# Patient Record
Sex: Male | Born: 1995 | Race: White | Hispanic: No | Marital: Single | Smoking: Current every day smoker
Health system: Southern US, Community
[De-identification: ages and names within clinical notes are randomized; demographics above are authoritative.]

## PROBLEM LIST (undated history)

## (undated) DIAGNOSIS — Q539 Undescended testicle, unspecified: Secondary | ICD-10-CM

## (undated) DIAGNOSIS — N200 Calculus of kidney: Secondary | ICD-10-CM

## (undated) HISTORY — DX: Undescended testicle, unspecified: Q53.9

## (undated) HISTORY — DX: Calculus of kidney: N20.0

---
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
## 2016-02-02 ENCOUNTER — Emergency Department: Payer: 59

## 2016-02-02 ENCOUNTER — Encounter: Payer: Self-pay | Admitting: Medical Oncology

## 2016-02-02 ENCOUNTER — Emergency Department
Admission: EM | Admit: 2016-02-02 | Discharge: 2016-02-02 | Disposition: A | Discharge status: Home / Self Care | Payer: 59 | Attending: Student | Admitting: Student

## 2016-02-02 DIAGNOSIS — N50812 Left testicular pain: Secondary | ICD-10-CM | POA: Diagnosis present

## 2016-02-02 DIAGNOSIS — R52 Pain, unspecified: Secondary | ICD-10-CM

## 2016-02-02 DIAGNOSIS — R1030 Lower abdominal pain, unspecified: Secondary | ICD-10-CM | POA: Insufficient documentation

## 2016-02-02 DIAGNOSIS — N201 Calculus of ureter: Secondary | ICD-10-CM | POA: Diagnosis not present

## 2016-02-02 LAB — URINALYSIS COMPLETE WITH MICROSCOPIC (ARMC ONLY)
BACTERIA UA: NONE SEEN
Bilirubin Urine: NEGATIVE
Glucose, UA: NEGATIVE mg/dL
Leukocytes, UA: NEGATIVE
Nitrite: NEGATIVE
Protein, ur: 30 mg/dL — AB
Specific Gravity, Urine: 1.026 (ref 1.005–1.030)
pH: 6 (ref 5.0–8.0)

## 2016-02-02 LAB — CHLAMYDIA/NGC RT PCR (ARMC ONLY)
CHLAMYDIA TR: NOT DETECTED
N gonorrhoeae: NOT DETECTED

## 2016-02-02 LAB — COMPREHENSIVE METABOLIC PANEL
ALK PHOS: 71 U/L (ref 38–126)
ALT: 23 U/L (ref 17–63)
ANION GAP: 11 (ref 5–15)
AST: 21 U/L (ref 15–41)
Albumin: 5 g/dL (ref 3.5–5.0)
BUN: 13 mg/dL (ref 6–20)
CALCIUM: 9.5 mg/dL (ref 8.9–10.3)
CO2: 25 mmol/L (ref 22–32)
CREATININE: 1.29 mg/dL — AB (ref 0.61–1.24)
Chloride: 103 mmol/L (ref 101–111)
Glucose, Bld: 110 mg/dL — ABNORMAL HIGH (ref 65–99)
Potassium: 3.7 mmol/L (ref 3.5–5.1)
SODIUM: 139 mmol/L (ref 135–145)
TOTAL PROTEIN: 8.2 g/dL — AB (ref 6.5–8.1)
Total Bilirubin: 1.3 mg/dL — ABNORMAL HIGH (ref 0.3–1.2)

## 2016-02-02 LAB — CBC WITH DIFFERENTIAL/PLATELET
Basophils Absolute: 0 10*3/uL (ref 0–0.1)
Basophils Relative: 0 %
EOS ABS: 0.1 10*3/uL (ref 0–0.7)
HCT: 50.7 % (ref 40.0–52.0)
HEMOGLOBIN: 17.3 g/dL (ref 13.0–18.0)
LYMPHS ABS: 1.7 10*3/uL (ref 1.0–3.6)
MCH: 28.1 pg (ref 26.0–34.0)
MCHC: 34.1 g/dL (ref 32.0–36.0)
MCV: 82.4 fL (ref 80.0–100.0)
Monocytes Absolute: 1.1 10*3/uL — ABNORMAL HIGH (ref 0.2–1.0)
Neutro Abs: 15.4 10*3/uL — ABNORMAL HIGH (ref 1.4–6.5)
Platelets: 264 10*3/uL (ref 150–440)
RBC: 6.15 MIL/uL — ABNORMAL HIGH (ref 4.40–5.90)
RDW: 13.4 % (ref 11.5–14.5)
WBC: 18.4 10*3/uL — ABNORMAL HIGH (ref 3.8–10.6)

## 2016-02-02 LAB — LIPASE, BLOOD: LIPASE: 22 U/L (ref 11–51)

## 2016-02-02 MED ORDER — SODIUM CHLORIDE 0.9 % IV BOLUS (SEPSIS)
1000.0000 mL | Freq: Once | INTRAVENOUS | Status: AC
  Administered 2016-02-02: 1000 mL via INTRAVENOUS

## 2016-02-02 MED ORDER — IOPAMIDOL (ISOVUE-300) INJECTION 61%
100.0000 mL | Freq: Once | INTRAVENOUS | Status: AC | PRN
  Administered 2016-02-02: 100 mL via INTRAVENOUS

## 2016-02-02 MED ORDER — OXYCODONE HCL 5 MG PO TABS
10.0000 mg | ORAL_TABLET | Freq: Once | ORAL | Status: AC
  Administered 2016-02-02: 10 mg via ORAL
  Filled 2016-02-02: qty 2

## 2016-02-02 MED ORDER — KETOROLAC TROMETHAMINE 30 MG/ML IJ SOLN
15.0000 mg | Freq: Once | INTRAMUSCULAR | Status: AC
  Administered 2016-02-02: 15 mg via INTRAVENOUS
  Filled 2016-02-02: qty 1

## 2016-02-02 MED ORDER — TAMSULOSIN HCL 0.4 MG PO CAPS: 0.4000 mg | ORAL_CAPSULE | Freq: Every day | ORAL | Status: AC

## 2016-02-02 MED ORDER — ONDANSETRON 4 MG PO TBDP
4.0000 mg | ORAL_TABLET | Freq: Once | ORAL | Status: AC
  Administered 2016-02-02: 4 mg via ORAL
  Filled 2016-02-02: qty 1

## 2016-02-02 MED ORDER — DIATRIZOATE MEGLUMINE & SODIUM 66-10 % PO SOLN
15.0000 mL | Freq: Once | ORAL | Status: AC
  Administered 2016-02-02: 15 mL via ORAL

## 2016-02-02 MED ORDER — ONDANSETRON 4 MG PO TBDP: 4.0000 mg | ORAL_TABLET | Freq: Three times a day (TID) | ORAL | Status: AC | PRN

## 2016-02-02 MED ORDER — OXYCODONE HCL 5 MG PO TABS: 5.0000 mg | ORAL_TABLET | Freq: Four times a day (QID) | ORAL | Status: AC | PRN

## 2016-02-02 NOTE — ED Notes (Signed)
Patient in lobby restroom vomiting. First nurse made aware and patient will be placed in room as soon as one is available.

## 2016-02-02 NOTE — ED Provider Notes (Signed)
[facility] Emergency Department Provider Note   ____________________________________________  Time seen: Approximately 2:16 PM  I have reviewed the triage vital signs and the nursing notes.   HISTORY  Chief Complaint Testicle Pain    HPI Jared Elliott is a 20 y.o. male with no chronic medical problems who presents for evaluation of 3 days of intermittent left testicle/scrotal pain and into the left abdomen and nonbloody nonbilious emesis, gradual onset, intermittent, currently moderate, no modifying factors or patient reports that he developed pain in the left groin/testicle and vomiting 3 days ago however it resolved. It recurred today. No diarrhea, no fevers or chills. No chest pain or difficulty breathing. He denies any penile discharge. He is sexually active and reports some concern for possible sexually transmitted infection. He denies any dysuria or hematuria. He had a normal bowel movement earlier this morning.   History reviewed. No pertinent past medical history.  There are no active problems to display for this patient.   History reviewed. No pertinent past surgical history.  No current outpatient prescriptions on file.  Allergies Review of patient's allergies indicates no known allergies.  No family history on file.  Social History Social History  Substance Use Topics  . Smoking status: None  . Smokeless tobacco: None  . Alcohol Use: None    Review of Systems Constitutional: No fever/chills Eyes: No visual changes. ENT: No sore throat. Cardiovascular: Denies chest pain. Respiratory: Denies shortness of breath. Gastrointestinal: No abdominal pain.  + nausea, +vomiting.  No diarrhea.  No constipation. Genitourinary: Negative for dysuria. Musculoskeletal: Negative for back pain. Skin: Negative for rash. Neurological: Negative for headaches, focal weakness or numbness.  10-point ROS otherwise  negative.  ____________________________________________   PHYSICAL EXAM:  Filed Vitals:   02/02/16 1635 02/02/16 1700 02/02/16 1730 02/02/16 1800  BP:  136/88 136/87 147/89  Pulse: 65 58 55 59  Temp:      TempSrc:      Resp:      Height:      Weight:      SpO2: 99% 98% 98% 97%    VITAL SIGNS: ED Triage Vitals  Enc Vitals Group     BP 02/02/16 1209 142/106 mmHg     Pulse Rate 02/02/16 1209 85     Resp 02/02/16 1209 18     Temp 02/02/16 1209 97.8 F (36.6 C)     Temp Source 02/02/16 1209 Oral     SpO2 02/02/16 1209 97 %     Weight 02/02/16 1209 220 lb (99.791 kg)     Height 02/02/16 1209 5\' 10"  (1.778 m)     Head Cir --      Peak Flow --      Pain Score 02/02/16 1209 10     Pain Loc --      Pain Edu? --      Excl. in GC? --     Constitutional: Alert and oriented. Well appearing and in no acute distress. Eyes: Conjunctivae are normal. PERRL. EOMI. Head: Atraumatic. Nose: No congestion/rhinnorhea. Mouth/Throat: Mucous membranes are moist.  Oropharynx non-erythematous. Neck: No stridor. Supple without meningismus.  Cardiovascular: Normal rate, regular rhythm. Grossly normal heart sounds.  Good peripheral circulation. Respiratory: Normal respiratory effort.  No retractions. Lungs CTAB. Gastrointestinal: Soft and nontender. No distention.  No CVA tenderness. Genitourinary: uncircumcised penis, testicles are descended bilaterally, mild tenderness in the left hemiscrotum without mass, color change, erythema, induration or fluctuance. No tenderness throughout the inguinal canal bilaterally,  no hernia sac or bulge. Musculoskeletal: No lower extremity tenderness nor edema.  No joint effusions. Neurologic:  Normal speech and language. No gross focal neurologic deficits are appreciated. No gait instability. Skin:  Skin is warm, dry and intact. No rash noted. Psychiatric: Mood and affect are normal. Speech and behavior are normal.  ____________________________________________    LABS (all labs ordered are listed, but only abnormal results are displayed)  Labs Reviewed  URINALYSIS COMPLETEWITH MICROSCOPIC ([facility] ONLY) - Abnormal; Notable for the following:    Color, Urine YELLOW (*)    APPearance CLEAR (*)    Ketones, ur 1+ (*)    Hgb urine dipstick 3+ (*)    Protein, ur 30 (*)    Squamous Epithelial / LPF 0-5 (*)    All other components within normal limits  CBC WITH DIFFERENTIAL/PLATELET - Abnormal; Notable for the following:    WBC 18.4 (*)    RBC 6.15 (*)    Neutro Abs 15.4 (*)    Monocytes Absolute 1.1 (*)    All other components within normal limits  COMPREHENSIVE METABOLIC PANEL - Abnormal; Notable for the following:    Glucose, Bld 110 (*)    Creatinine, Ser 1.29 (*)    Total Protein 8.2 (*)    Total Bilirubin 1.3 (*)    All other components within normal limits  CHLAMYDIA/NGC RT PCR ([facility] ONLY)  LIPASE, BLOOD   ____________________________________________  EKG  none ____________________________________________  RADIOLOGY  Ultrasound scrotum IMPRESSION: 1. There is no evidence of testicular torsion, intra testicular mass, nor orchitis. 2. Simple appearing right-sided epididymal cyst measuring up to 5.2 mm in greatest dimension. No vascular changes to suggest acute epididymitis. Normal-appearing left epididymis. 3. No left-sided scrotal abnormalities are observed.  CT abdomen and pelvis IMPRESSION: 5 x 3 mm calculus distal left ureter with mild hydronephrosis on the left. There is mild delayed enhancement of the left kidney compared to the right, suggesting a degree of parenchymal edema in the left kidney.  No lesions are identified in either inguinal region.  No bowel obstruction. No abscess. Appendix appears normal.  Bibasilar lung atelectatic  ____________________________________________   PROCEDURES  Procedure(s) performed: None  Critical Care performed:  No  ____________________________________________   INITIAL IMPRESSION / ASSESSMENT AND PLAN / ED COURSE  Pertinent labs & imaging results that were available during my care of the patient were reviewed by me and considered in my medical decision making (see chart for details).  Jared Elliott is a 20 y.o. male with no chronic medical problems who presents for evaluation of 3 days of intermittent left testicle/scrotal and abdominal pain and nonbloody nonbilious emesis. On exam, he is nontoxic appearing and in no acute distress. His vital signs are stable, he is afebrile. He has mild tenderness of the left hemiscrotum but otherwise his exam is unremarkable. Ultrasound of the scrotum obtain at triage shows no torsion or orchitis, there is a simple right-sided ependymal cyst but no evidence of epididymitis. We'll obtain screening labs, ua, gc/chlamydia, treat him symptomatically, reassess for need for advanced imaging.  ----------------------------------------- 6:15 PM on 02/02/2016 ----------------------------------------- Patient with significant improvement of his pain at this time. CBC was notable for leukocytosis. CMP with mild creatinine elevation at 1.29, IV fluids given. Urinalysis with several red blood cells but does not appear infected. CT of the abdomen and pelvis shows a 5 mm distal left ureteral calculus which is the most likely cause of his symptoms/complaints today. His pain is well controlled, he does not  appear septic. We'll DC with return precautions, expectant management, close urology follow-up. The patient and his family at bedside are comfortable with the discharge plan. GC and chlamydia are still pending at the time of discharge.  ____________________________________________   FINAL CLINICAL IMPRESSION(S) / ED DIAGNOSES  Final diagnoses:  Pain in left testicle  Ureterolithiasis      NEW MEDICATIONS STARTED DURING THIS VISIT:  New Prescriptions   No medications  on file     Note:  This document was prepared using Dragon voice recognition software and may include unintentional dictation errors.    Gayla Doss, MD 02/02/16 [phone]

## 2016-02-02 NOTE — ED Notes (Signed)
Pt c/o left sided testicular pain that started Saturday and went away then began again this am. Denies injury.

## 2016-02-12 ENCOUNTER — Ambulatory Visit (INDEPENDENT_AMBULATORY_CARE_PROVIDER_SITE_OTHER): Payer: 59 | Admitting: Urology

## 2016-02-12 ENCOUNTER — Encounter: Payer: Self-pay | Admitting: Urology

## 2016-02-12 VITALS — BP 129/87 | HR 98 | Ht 70.0 in | Wt 222.0 lb

## 2016-02-12 DIAGNOSIS — N132 Hydronephrosis with renal and ureteral calculous obstruction: Secondary | ICD-10-CM

## 2016-02-12 DIAGNOSIS — N201 Calculus of ureter: Secondary | ICD-10-CM

## 2016-02-12 DIAGNOSIS — N503 Cyst of epididymis: Secondary | ICD-10-CM | POA: Diagnosis not present

## 2016-02-12 LAB — MICROSCOPIC EXAMINATION
BACTERIA UA: NONE SEEN
RBC, UA: >30 /hpf — AB (ref 0–?)
WBC, UA: NONE SEEN /hpf (ref 0–?)

## 2016-02-12 LAB — URINALYSIS, COMPLETE
BILIRUBIN UA: NEGATIVE
Glucose, UA: NEGATIVE
Ketones, UA: NEGATIVE
LEUKOCYTES UA: NEGATIVE
Nitrite, UA: NEGATIVE
PH UA: 8.5 — AB (ref 5.0–7.5)
PROTEIN UA: NEGATIVE
Specific Gravity, UA: 1.02 (ref 1.005–1.030)
Urobilinogen, Ur: 0.2 mg/dL (ref 0.2–1.0)

## 2016-02-12 NOTE — Progress Notes (Signed)
02/12/2016 11:41 AM   Lisabeth Pick April 16, 1996 [id number]  Referring provider: Dortha Kern, MD [address] [place], [place] [address]  Chief Complaint  Patient presents with  . Nephrolithiasis    New Patient    HPI: Pleasant 20 year old male who presents to the office today to establish care. He was significant the emergency room  on 02/02/2016 with acute onset left flank pain radiating down to his testicle. He underwent further workup including a noncontrast CT scan and scrotal ultrasound. His scrotal ultrasound was unremarkable. He was noted to have a 5 x 3 mm left UVJ stone.  At the time of presentation, he did have an elevated white count to 18.4 with a negative UA.  Creatinine was also mildly elevated to 1.29. He denied any associated fevers or chills. He was having some nausea and vomiting which is since resolved.  He has continued to strain his urine on most occasions and has not seen the stone pass. He continues to have some mild dull left lower quadrant pain but not nearly as significant as before. No dysuria or gross hematuria. No fevers or chills.  He has no previous history of kidney stones prior to this.  He is accompanied to the office today by his brother.  PMH: Past Medical History  Diagnosis Date  . Nephrolithiasis   . Undescended testicle     Surgical History: History reviewed. No pertinent past surgical history.  Home Medications:    Medication List       This list is accurate as of: 02/12/16 11:59 PM.  Always use your most recent med list.               ondansetron 4 MG disintegrating tablet  Commonly known as:  ZOFRAN ODT  Take 1 tablet (4 mg total) by mouth every 8 (eight) hours as needed for nausea or vomiting.     oxyCODONE 5 MG immediate release tablet  Commonly known as:  ROXICODONE  Take 1 tablet (5 mg total) by mouth every 6 (six) hours as needed for moderate pain. Do not drive while taking this medication.     tamsulosin 0.4  MG Caps capsule  Commonly known as:  FLOMAX  Take 1 capsule (0.4 mg total) by mouth daily.        Allergies: No Known Allergies  Family History: Family History  Problem Relation Age of Onset  . Bladder Cancer Neg Hx   . Prostate cancer Neg Hx   . Kidney cancer Neg Hx     Social History:  reports that he has been smoking Cigarettes.  He has been smoking about 0.50 packs per day. He does not have any smokeless tobacco history on file. He reports that he uses illicit drugs (Marijuana). He reports that he does not drink alcohol.  ROS: UROLOGY Frequent Urination?: No Hard to postpone urination?: No Burning/pain with urination?: No Get up at night to urinate?: No Leakage of urine?: No Urine stream starts and stops?: No Trouble starting stream?: No Do you have to strain to urinate?: No Blood in urine?: No Urinary tract infection?: No Sexually transmitted disease?: No Injury to kidneys or bladder?: No Painful intercourse?: No Weak stream?: No Erection problems?: No Penile pain?: Yes  Gastrointestinal Nausea?: Yes Vomiting?: Yes Indigestion/heartburn?: No Diarrhea?: No Constipation?: No  Constitutional Fever: No Night sweats?: No Weight loss?: No Fatigue?: No  Skin Skin rash/lesions?: No Itching?: No  Eyes Blurred vision?: No Double vision?: No  Ears/Nose/Throat Sore throat?: No  Sinus problems?: No  Hematologic/Lymphatic Swollen glands?: No Easy bruising?: No  Cardiovascular Leg swelling?: No Chest pain?: No  Respiratory Cough?: No Shortness of breath?: No  Endocrine Excessive thirst?: No  Musculoskeletal Back pain?: No Joint pain?: No  Neurological Headaches?: No Dizziness?: No  Psychologic Depression?: No Anxiety?: No  Physical Exam: BP 129/87 mmHg  Pulse 98  Ht  (1.778 m)  Wt 222 lb (100.699 kg)  BMI 31.85 kg/m2  Constitutional:  Alert and oriented, No acute distress. HEENT: [place] AT, moist mucus membranes.  Trachea  midline, no masses. Cardiovascular: No clubbing, cyanosis, or edema. Respiratory: Normal respiratory effort, no increased work of breathing. GI: Abdomen is soft, nontender, nondistended, no abdominal masses GU: No CVA tenderness.  Skin: No rashes, bruises or suspicious lesions. Lymph: No cervical denopathy. Neurologic: Grossly intact, no focal deficits, moving all 4 extremities. Psychiatric: Normal mood and affect.  Laboratory Data: Lab Results  Component Value Date   WBC 18.4* 02/02/2016   HGB 17.3 02/02/2016   HCT 50.7 02/02/2016   MCV 82.4 02/02/2016   PLT 264 02/02/2016    Lab Results  Component Value Date   CREATININE 1.29* 02/02/2016   Urinalysis Results for orders placed or performed in visit on 02/12/16  Microscopic Examination  Result Value Ref Range   WBC, UA None seen 0 -  5 /hpf   RBC, UA >30 (A) 0 -  2 /hpf   Epithelial Cells (non renal) 0-10 0 - 10 /hpf   Bacteria, UA None seen None seen/Few  Urinalysis, Complete  Result Value Ref Range   Specific Gravity, UA 1.020 1.005 - 1.030   pH, UA 8.5 (H) 5.0 - 7.5   Color, UA Yellow Yellow   Appearance Ur Cloudy (A) Clear   Leukocytes, UA Negative Negative   Protein, UA Negative Negative/Trace   Glucose, UA Negative Negative   Ketones, UA Negative Negative   RBC, UA 3+ (A) Negative   Bilirubin, UA Negative Negative   Urobilinogen, Ur 0.2 0.2 - 1.0 mg/dL   Nitrite, UA Negative Negative   Microscopic Examination See below:     Pertinent Imaging: Study Result     CLINICAL DATA: Left-sided inguinal and scrotal region pain  EXAM: CT ABDOMEN AND PELVIS WITH CONTRAST  TECHNIQUE: Multidetector CT imaging of the abdomen and pelvis was performed using the standard protocol following bolus administration of intravenous contrast. Oral contrast was also administered.  CONTRAST: ISOVUE-300 IOPAMIDOL (ISOVUE-300) INJECTION 61%  COMPARISON: Scrotal ultrasound Feb 02, 2016  FINDINGS: Lower chest:  There is atelectatic change in the posterior left and right lung bases, slightly more on the left than on the right. Lung bases otherwise are clear.  Hepatobiliary: There is focal fatty infiltration near the fissure for the ligamentum teres. Beyond this apparent focal fatty infiltration, no focal liver lesions are evident. Gallbladder wall is not appreciably thickened. There is no biliary duct dilatation.  Pancreas: No pancreatic mass or inflammatory focus is evident.  Spleen: No splenic lesions are evident. A splenule is noted medial to the spleen.  Adrenals/Urinary Tract: Adrenals appear normal bilaterally. There is no renal mass on either side. There is mild delayed enhancement on the left compared to the right side which appears normal. There is no hydronephrosis on the right. There is mild hydronephrosis on the left. There is no right-sided intrarenal or ureteral calculus. There is no intrarenal calculus on the left. There is, however, a calculus in the distal left ureter near the ureterovesical junction measuring  5 x 3 mm. No other ureteral calculi are evident. Urinary bladder is midline with wall thickness within normal limits.  Stomach/Bowel: There is no bowel wall or mesenteric thickening. No bowel obstruction. No free air or portal venous air.  Vascular/Lymphatic: There is no abdominal aortic aneurysm. No vascular lesions are evident. A circumaortic left renal vein is an anatomic variant.  Reproductive: Prostate and seminal vesicles appear normal. No lesions are identified in the scrotal areas by CT. There is no pelvic mass or pelvic fluid collection.  Other: Appendix appears normal. There is no ascites or abscess in the abdomen or pelvis.  Musculoskeletal: There are no blastic or lytic bone lesions. There is no intramuscular or abdominal wall lesion.  IMPRESSION: 5 x 3 mm calculus distal left ureter with mild hydronephrosis on the left. There is mild  delayed enhancement of the left kidney compared to the right, suggesting a degree of parenchymal edema in the left kidney.  No lesions are identified in either inguinal region.  No bowel obstruction. No abscess. Appendix appears normal.  Bibasilar lung atelectatic change posteriorly noted.   Electronically Signed  By: Bretta BangWilliam Woodruff III M.D.  On: 02/02/2016 17:32           Study Result     CLINICAL DATA: Left-sided scrotal discomfort began 3 days ago and then stopped an returned today; no known injury.  EXAM: SCROTAL ULTRASOUND  DOPPLER ULTRASOUND OF THE TESTICLES  TECHNIQUE: Complete ultrasound examination of the testicles, epididymis, and other scrotal structures was performed. Color and spectral Doppler ultrasound were also utilized to evaluate blood flow to the testicles.  COMPARISON: None in PACs  FINDINGS: Right testicle  Measurements: 3.9 x 2.1 x 2.5 cm. No mass or microlithiasis visualized.  Left testicle  Measurements: 3.9 x 2.1 x 2.5 cm. No mass or microlithiasis visualized.  Right epididymis: There it is a simple appearing right epididymal cyst measuring 3.9 x 5.2 x 3.5 mm. Vascularity within the epididymis is normal.  Left epididymis: Normal in size and appearance.  Hydrocele: None visualized.  Varicocele: None visualized.  Pulsed Doppler interrogation of both testes demonstrates normal low resistance arterial and venous waveforms bilaterally.  IMPRESSION: 1. There is no evidence of testicular torsion, intra testicular mass, nor orchitis. 2. Simple appearing right-sided epididymal cyst measuring up to 5.2 mm in greatest dimension. No vascular changes to suggest acute epididymitis. Normal-appearing left epididymis. 3. No left-sided scrotal abnormalities are observed.   Electronically Signed  By: David SwazilandJordan M.D.  On: 02/02/2016 13:34    CT scan of scrotal ultrasound personally reviewed  today and with the patient.  Assessment & Plan:    1. Ureteral calculus, left We discussed various treatment options including ESWL vs. ureteroscopy, laser lithotripsy, and stent vs. Continued medical explusive therapy. We discussed the risks and benefits of both including bleeding, infection, damage to surrounding structures, efficacy with need for possible further intervention, and need for temporary ureteral stent.  He is most interested in continued medical expulsive therapy. He was given additional urinary strainer and Flomax.  Reviewed warning symptoms in detail today including fevers, chills, or any other significant urinary symptoms. He will present emergently to ED if he has any concerning signs or symptoms.  Plan for follow-up in 2 weeks with a KUB for further assessment. He was advised if he does see the stone pass, he should bring up her stone analysis.  - Urinalysis, Complete - DG Abd 1 View; Future  2. Hydronephrosis with urinary obstruction due to ureteral  calculus Secondary to #1  3. Epididymal cyst Incidental right epididymal cyst on scrotal ultrasound, asymptomatic. No intervention recommended.   Return in about 2 weeks (around 02/26/2016) for KUB (any provider if needed).  Vanna Scotland, MD  [facility] [address] [place], [place] [address] [phone]

## 2016-02-16 ENCOUNTER — Encounter: Payer: Self-pay | Admitting: Urology

## 2016-02-18 ENCOUNTER — Other Ambulatory Visit: Payer: Self-pay | Admitting: Radiology

## 2016-02-18 DIAGNOSIS — N200 Calculus of kidney: Secondary | ICD-10-CM

## 2016-02-22 ENCOUNTER — Ambulatory Visit
Admission: RE | Admit: 2016-02-22 | Discharge: 2016-02-22 | Disposition: A | Discharge status: Home / Self Care | Payer: 59 | Source: Ambulatory Visit | Attending: Urology | Admitting: Urology

## 2016-02-22 DIAGNOSIS — Z87442 Personal history of urinary calculi: Secondary | ICD-10-CM | POA: Insufficient documentation

## 2016-02-22 DIAGNOSIS — N200 Calculus of kidney: Secondary | ICD-10-CM

## 2016-02-24 ENCOUNTER — Telehealth: Payer: Self-pay

## 2016-02-24 NOTE — Telephone Encounter (Signed)
-----   Message from Jared BattiestShannon A McGowan, PA-C sent at 02/22/2016  5:50 PM EDT ----- Patient's RUS is normal.  He does not need to follow up.

## 2016-02-24 NOTE — Telephone Encounter (Signed)
Spoke with pt mother, Zella BallRobin, in reference to RUS results. Robin voiced understanding.

## 2016-02-26 ENCOUNTER — Ambulatory Visit: Payer: 59 | Admitting: Urology

## 2016-03-21 ENCOUNTER — Ambulatory Visit: Payer: 59 | Admitting: Urology

## 2016-03-30 ENCOUNTER — Other Ambulatory Visit: Payer: Self-pay | Admitting: Urology

## 2016-06-14 IMAGING — CT CT ABD-PELV W/ CM
2 of 7 series · 15 of 46 positions shown, 17 images · IV contrast (iopamidol)
Comparison: Scrotal ultrasound February 02, 2016

CLINICAL DATA: Left-sided inguinal and scrotal region pain

EXAM:
CT ABDOMEN AND PELVIS WITH CONTRAST
TECHNIQUE: Multidetector CT imaging of the abdomen and pelvis was performed
using the standard protocol following bolus administration of
intravenous contrast. Oral contrast was also administered.
CONTRAST:  100mL [id number] IOPAMIDOL ([id number]-ZII) INJECTION 61%

[Series 2: routine abd pel with · axial · 0.77mm/px · z∈[-999,-489]mm · 12 of 115 slices shown, 14 images]
[im 7/115  soft-tissue]
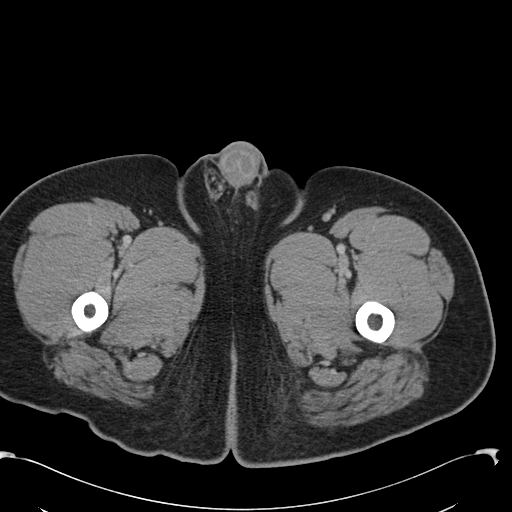
[im 7/115  bone]
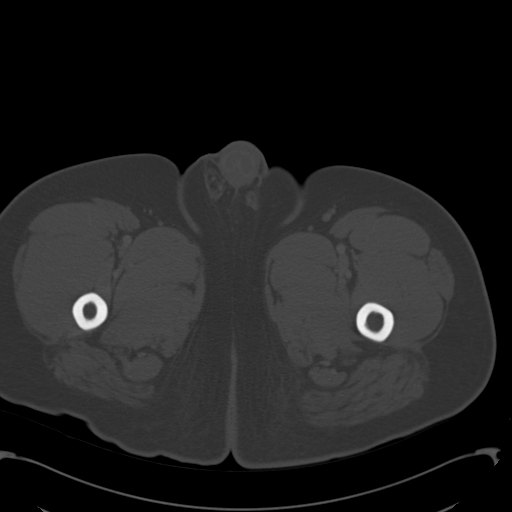
[im 19/115  soft-tissue]
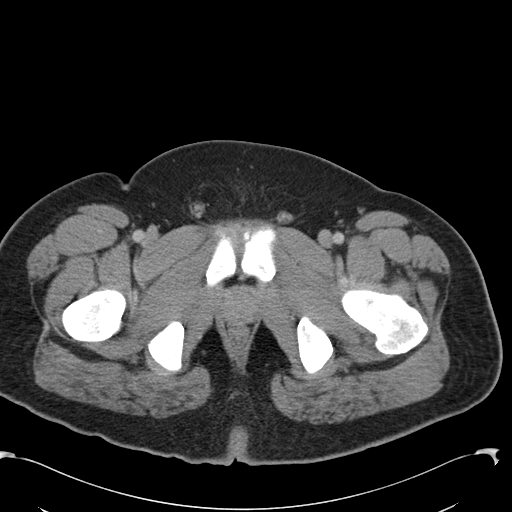
[im 25/115  soft-tissue]
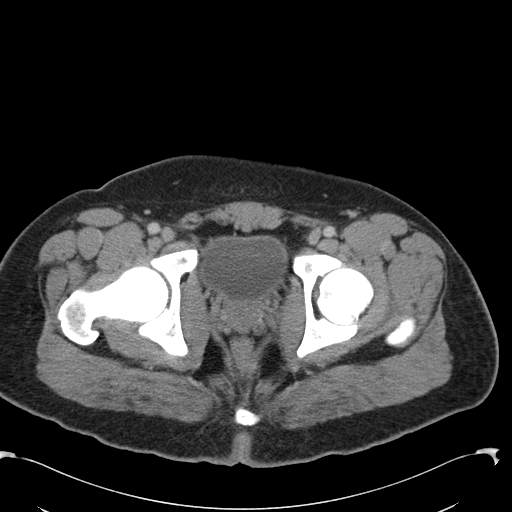
[im 37/115  soft-tissue]
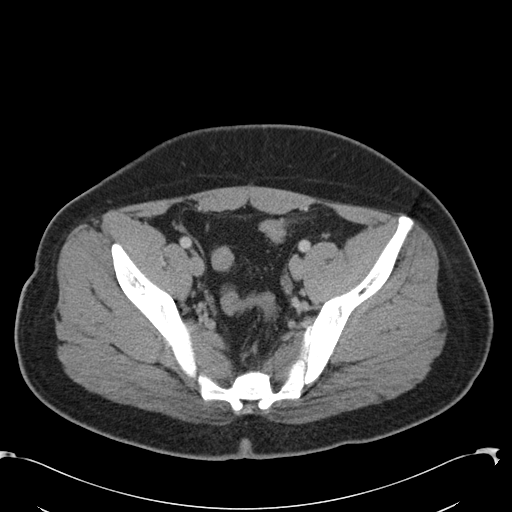
[im 43/115  soft-tissue]
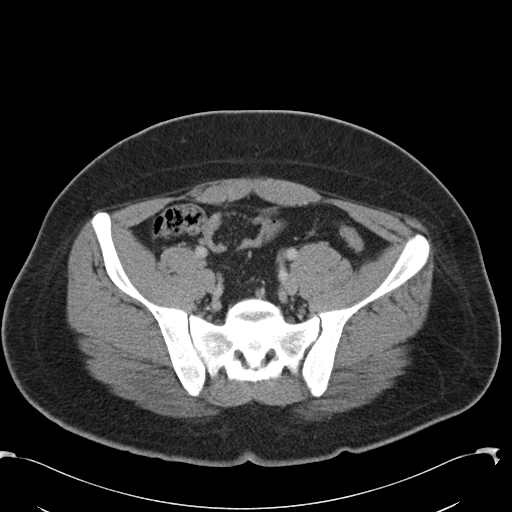
[im 55/115  soft-tissue]
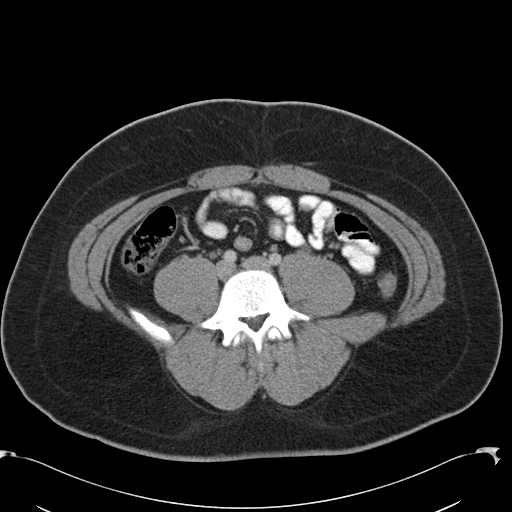
[im 61/115  soft-tissue]
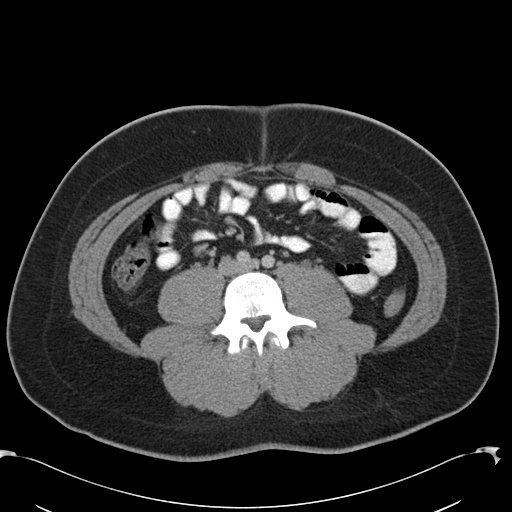
[im 73/115  soft-tissue]
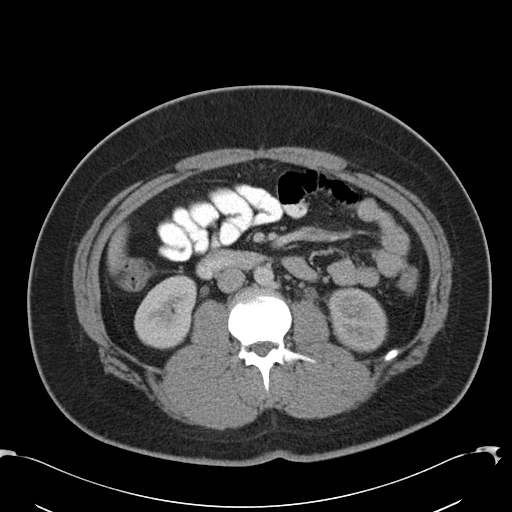
[im 79/115  soft-tissue]
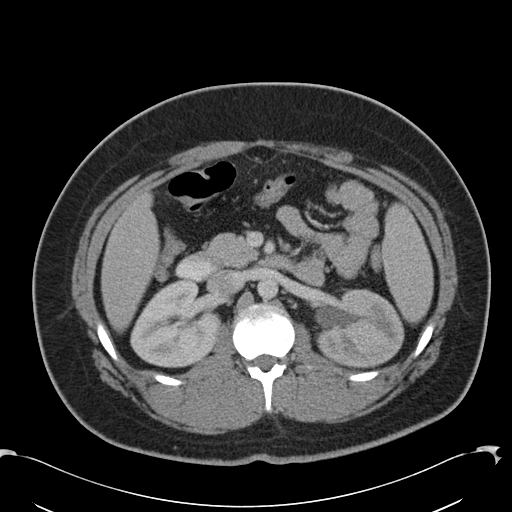
[im 79/115  bone]
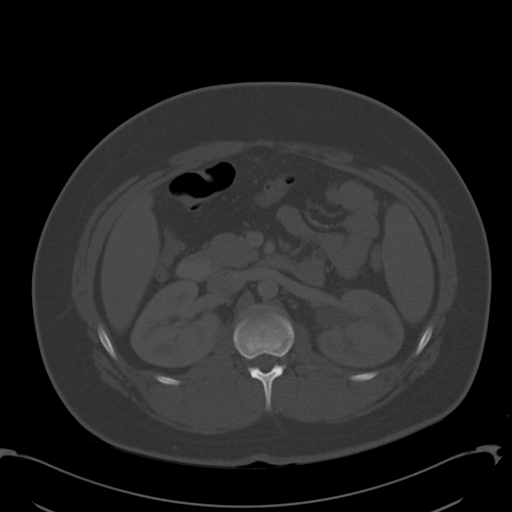
[im 91/115  soft-tissue]
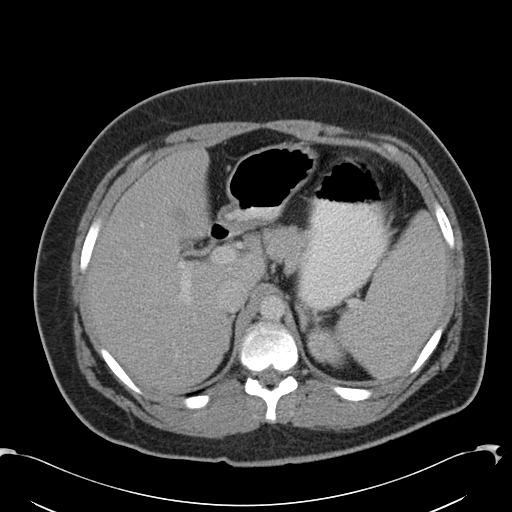
[im 97/115  soft-tissue]
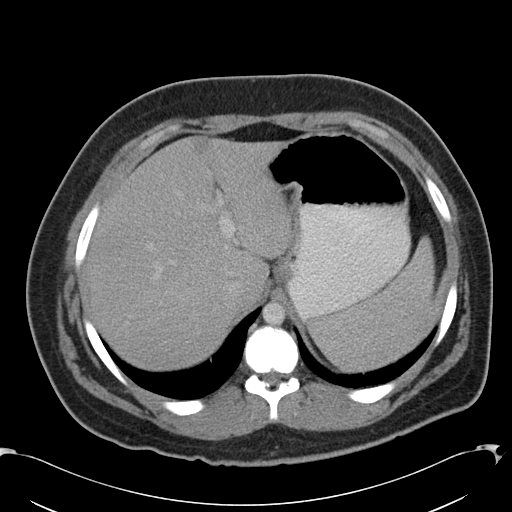
[im 109/115  soft-tissue]
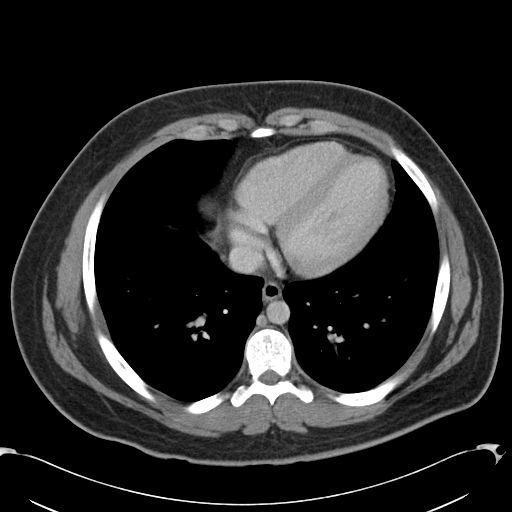

[Series 7: cor routine abd pel with · coronal · 0.76mm/px · 3 of 135 slices shown]
[im 34/135  soft-tissue]
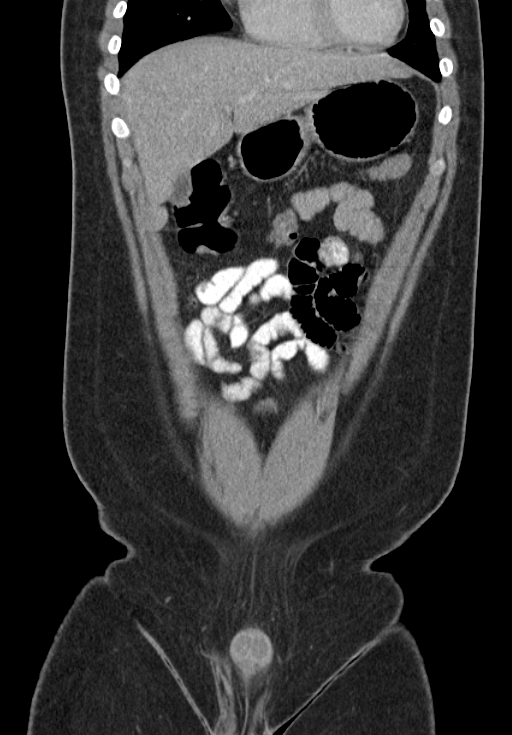
[im 68/135  soft-tissue]
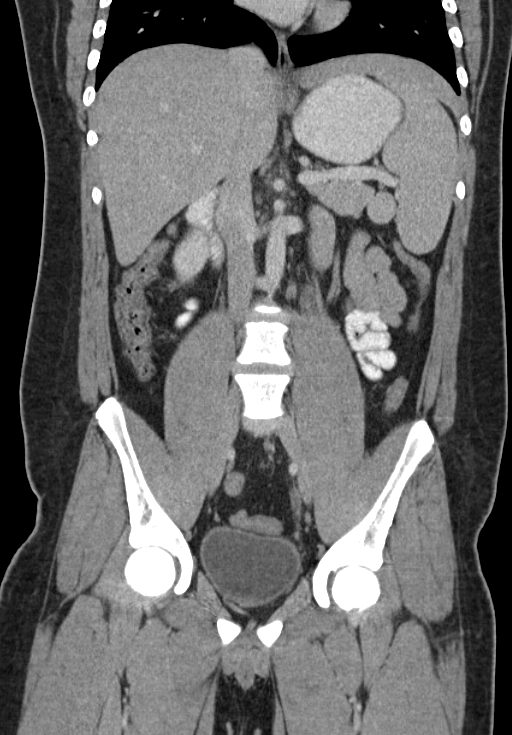
[im 101/135  soft-tissue]
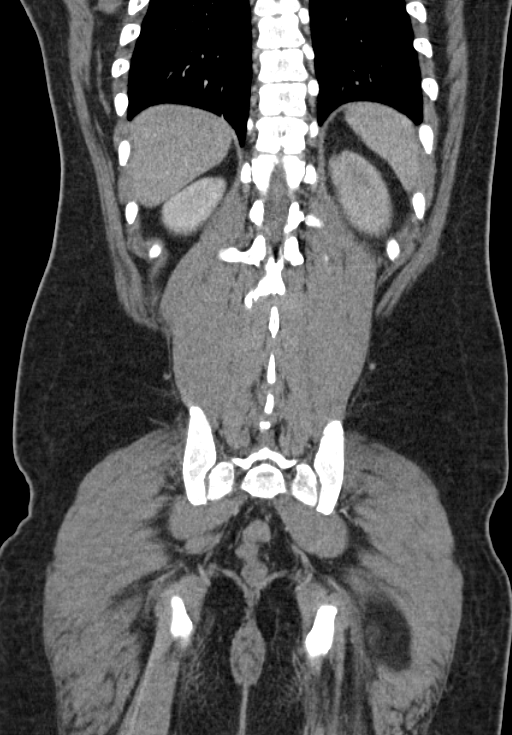

[15 of 46 positions shown; findings below may reference images not displayed]

FINDINGS: Lower chest: There is atelectatic change in the posterior left and
right lung bases, slightly more on the left than on the right. Lung
bases otherwise are clear.

Hepatobiliary: There is focal fatty infiltration near the fissure
for the ligamentum teres. Beyond this apparent focal fatty
infiltration, no focal liver lesions are evident. Gallbladder wall
is not appreciably thickened. There is no biliary duct dilatation.

Pancreas: No pancreatic mass or inflammatory focus is evident.

Spleen: No splenic lesions are evident. A splenule is noted medial
to the spleen.

Adrenals/Urinary Tract: Adrenals appear normal bilaterally. There is
no renal mass on either side. There is mild delayed enhancement on
the left compared to the right side which appears normal. There is
no hydronephrosis on the right. There is mild hydronephrosis on the
left. There is no right-sided intrarenal or ureteral calculus. There
is no intrarenal calculus on the left. There is, however, a calculus
in the distal left ureter near the ureterovesical junction measuring
5 x 3 mm. No other ureteral calculi are evident. Urinary bladder is
midline with wall thickness within normal limits.

Stomach/Bowel: There is no bowel wall or mesenteric thickening. No
bowel obstruction. No free air or portal venous air.

Vascular/Lymphatic: There is no abdominal aortic aneurysm. No
vascular lesions are evident. A circumaortic left renal vein is an
anatomic variant.

Reproductive: Prostate and seminal vesicles appear normal. No
lesions are identified in the scrotal areas by CT. There is no
pelvic mass or pelvic fluid collection.

Other: Appendix appears normal. There is no ascites or abscess in
the abdomen or pelvis.

Musculoskeletal: There are no blastic or lytic bone lesions. There
is no intramuscular or abdominal wall lesion.
IMPRESSION: 5 x 3 mm calculus distal left ureter with mild hydronephrosis on the
left. There is mild delayed enhancement of the left kidney compared
to the right, suggesting a degree of parenchymal edema in the left
kidney.

No lesions are identified in either inguinal region.

No bowel obstruction.  No abscess.  Appendix appears normal.

Bibasilar lung atelectatic change posteriorly noted.

## 2017-07-15 IMAGING — US US SCROTUM
1 series · 13 of 25 positions shown · non-contrast
Comparison: None in PACs

CLINICAL DATA: Left-sided scrotal discomfort began 3 days ago and
then stopped an returned today; no known injury.

EXAM:
SCROTAL ULTRASOUND
DOPPLER ULTRASOUND OF THE TESTICLES
TECHNIQUE: Complete ultrasound examination of the testicles, epididymis, and
other scrotal structures was performed. Color and spectral Doppler
ultrasound were also utilized to evaluate blood flow to the
testicles.

[Series 1: us scrotum · 0.07mm/px · 13 of 48 slices shown]
[im 1/48]
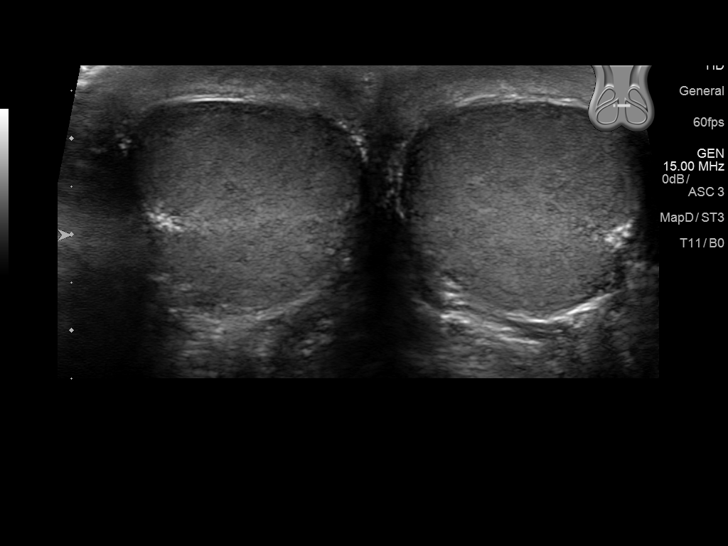
[im 4/48]
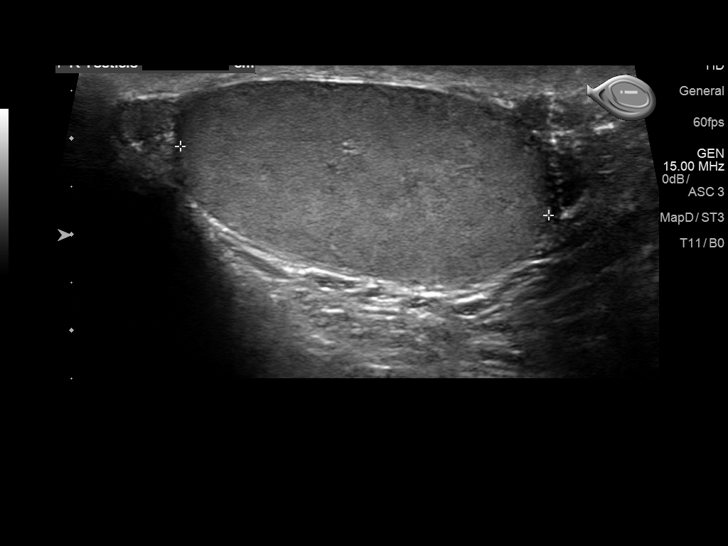
[im 8/48]
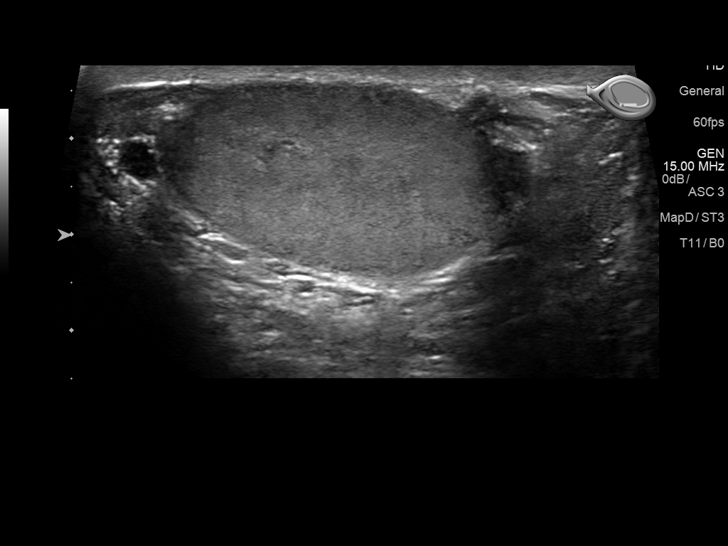
[im 12/48]
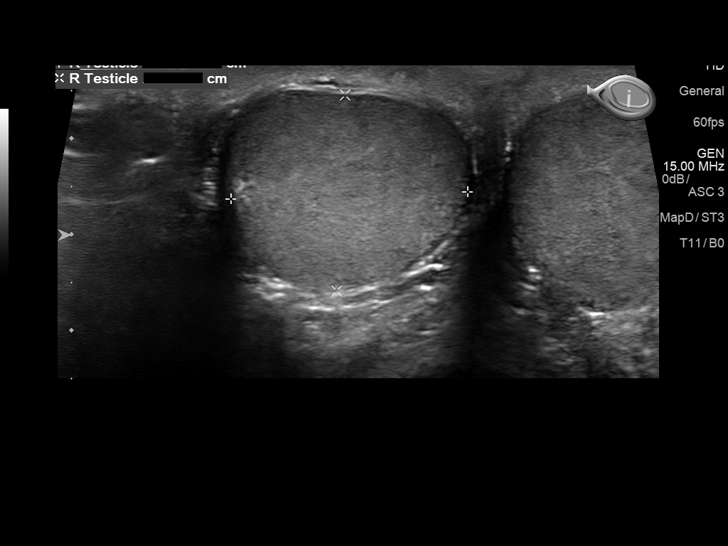
[im 16/48]
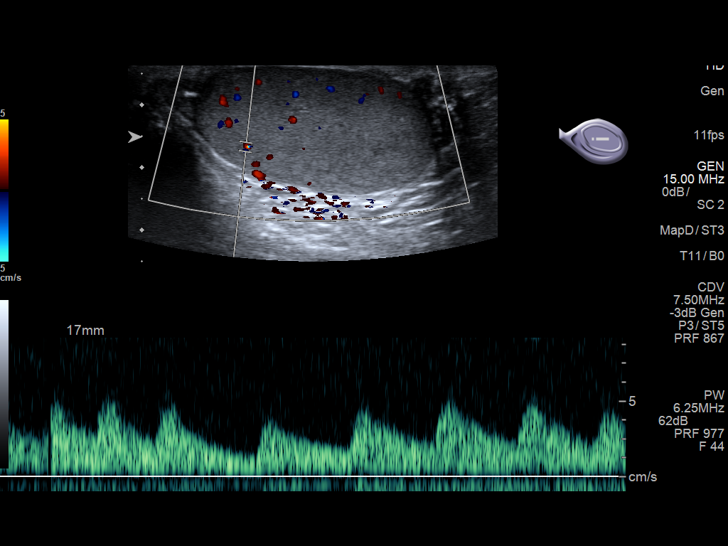
[im 20/48]
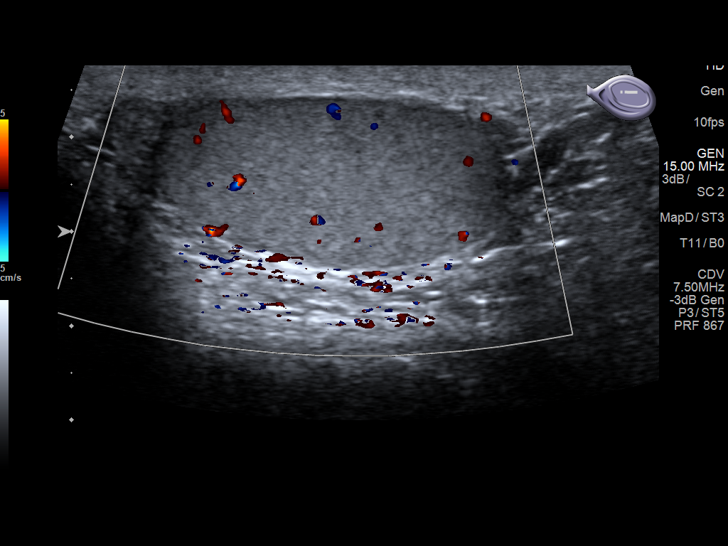
[im 24/48]
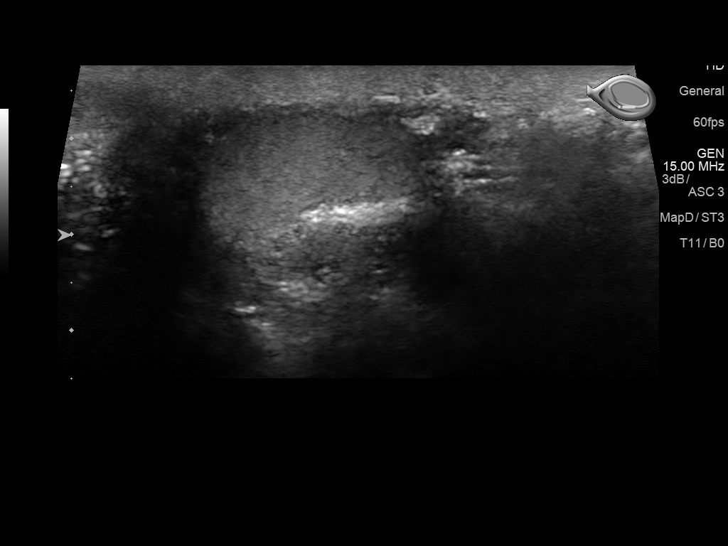
[im 28/48]
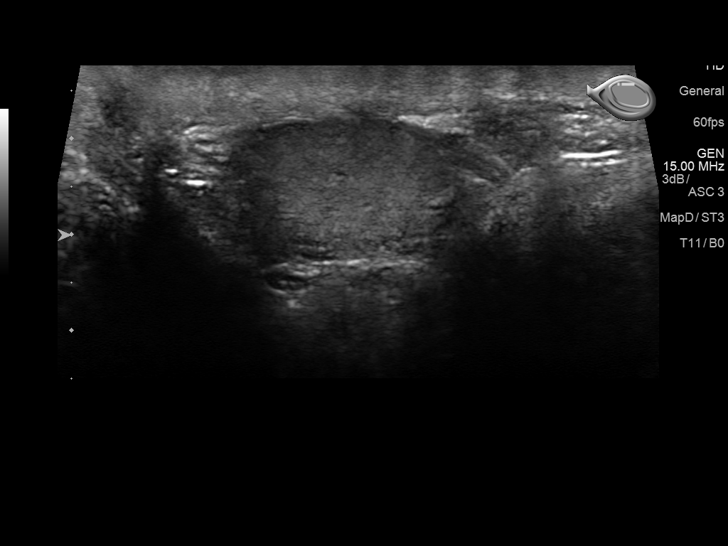
[im 32/48]
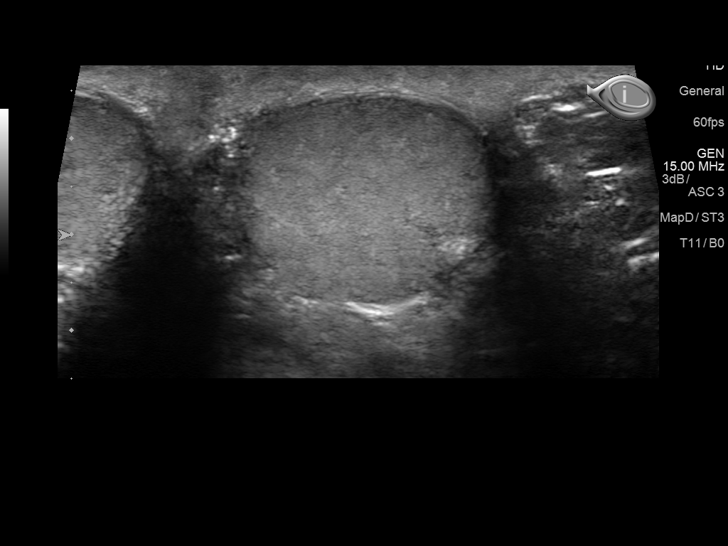
[im 36/48]
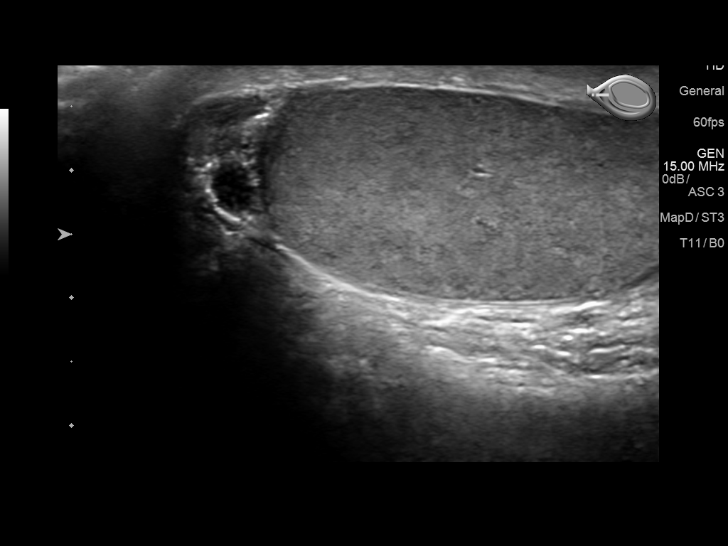
[im 40/48]
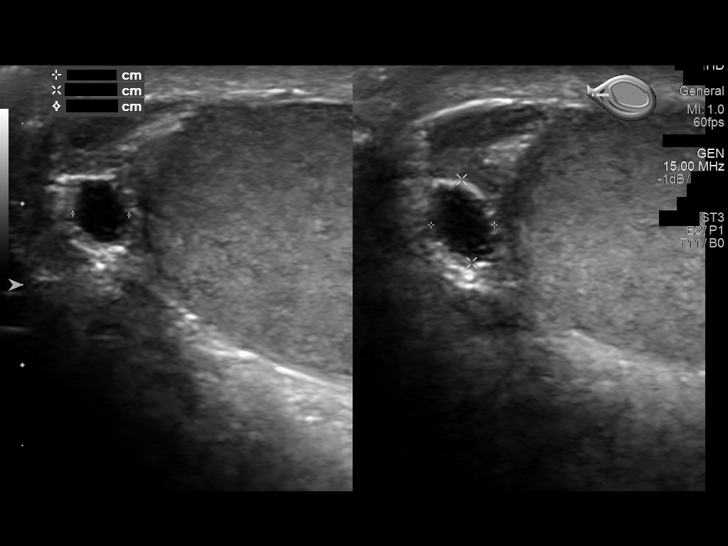
[im 44/48]
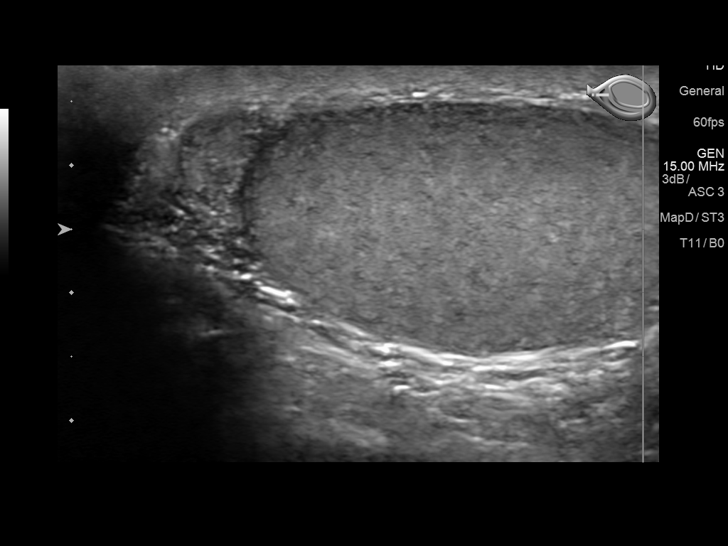
[im 48/48]
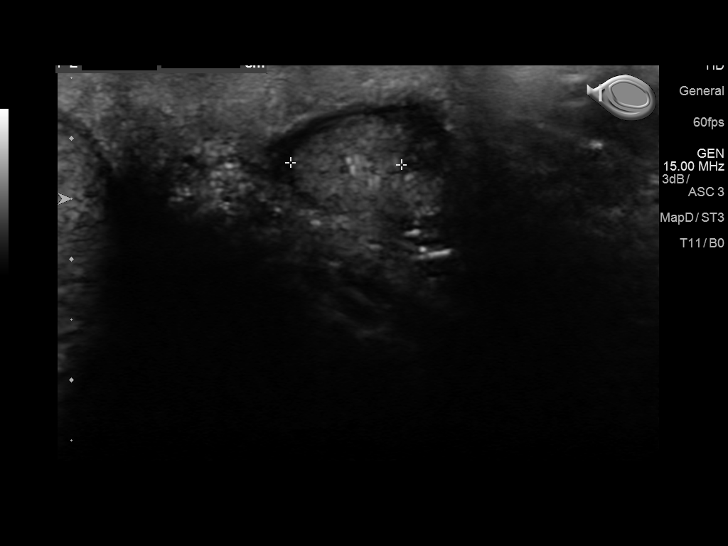

[13 of 25 positions shown; findings below may reference images not displayed]

FINDINGS: Right testicle

Measurements: 3.9 x 2.1 x 2.5 cm. No mass or microlithiasis
visualized.

Left testicle

Measurements: 3.9 x 2.1 x 2.5 cm. No mass or microlithiasis
visualized.

Right epididymis: There it is a simple appearing right epididymal
cyst measuring 3.9 x 5.2 x 3.5 mm. Vascularity within the epididymis
is normal.

Left epididymis:  Normal in size and appearance.

Hydrocele:  None visualized.

Varicocele:  None visualized.

Pulsed Doppler interrogation of both testes demonstrates normal low
resistance arterial and venous waveforms bilaterally.
IMPRESSION: 1. There is no evidence of testicular torsion, intra testicular
mass, nor orchitis.
2. Simple appearing right-sided epididymal cyst measuring up to
mm in greatest dimension. No vascular changes to suggest acute
epididymitis. Normal-appearing left epididymis.
3. No left-sided scrotal abnormalities are observed.

## 2017-08-04 IMAGING — US US RENAL
1 series · 14 of 25 positions shown · non-contrast
Comparison: CT, 02/02/2016

CLINICAL DATA: Left ureteral stone noted on CT dated 02/02/2016.
Followup exam.

EXAM:
RENAL / URINARY TRACT ULTRASOUND COMPLETE

[Series 1: us renal · 0.25mm/px · 14 of 29 slices shown]
[im 1/29]
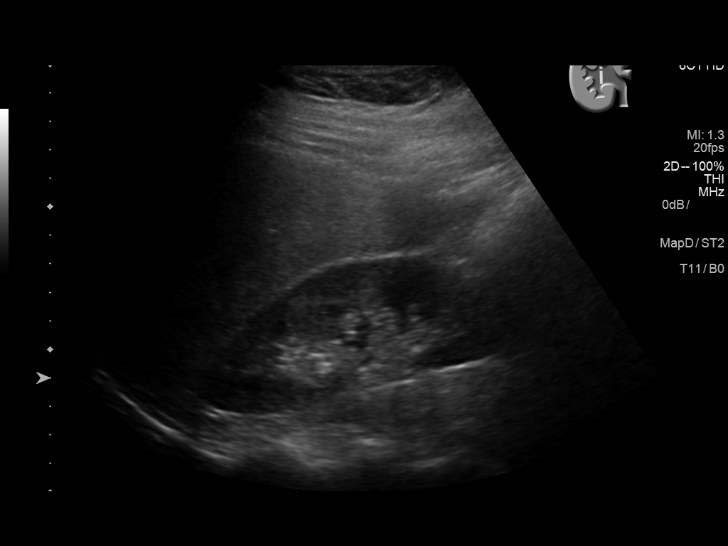
[im 3/29]
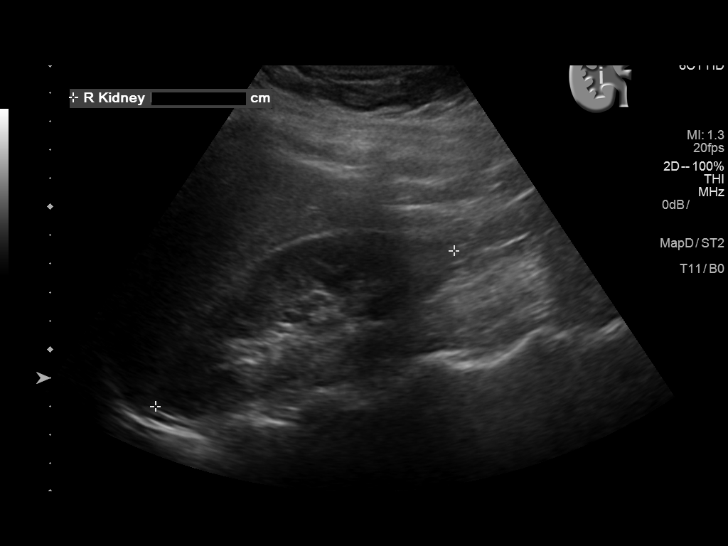
[im 5/29]
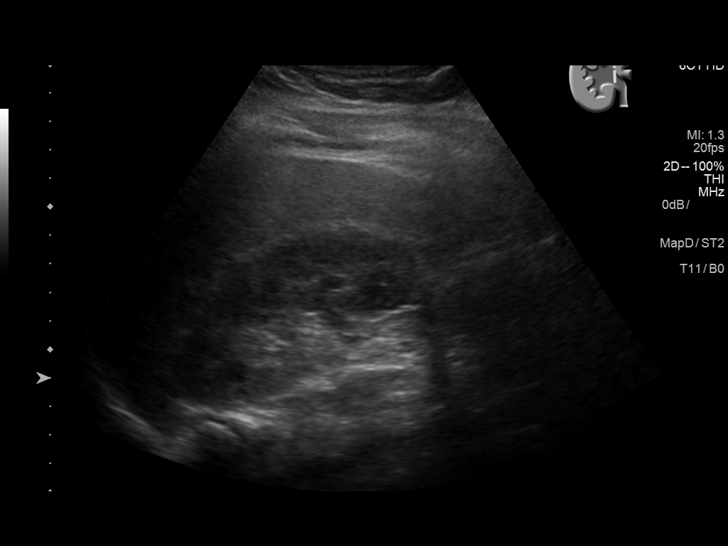
[im 8/29]
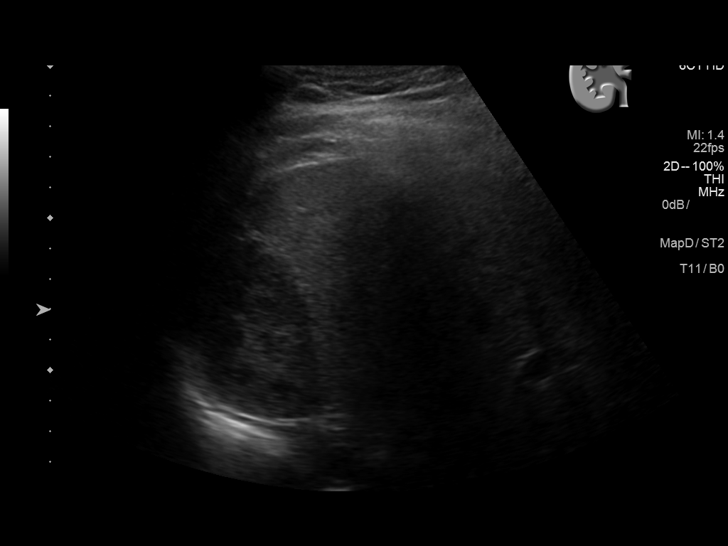
[im 10/29]
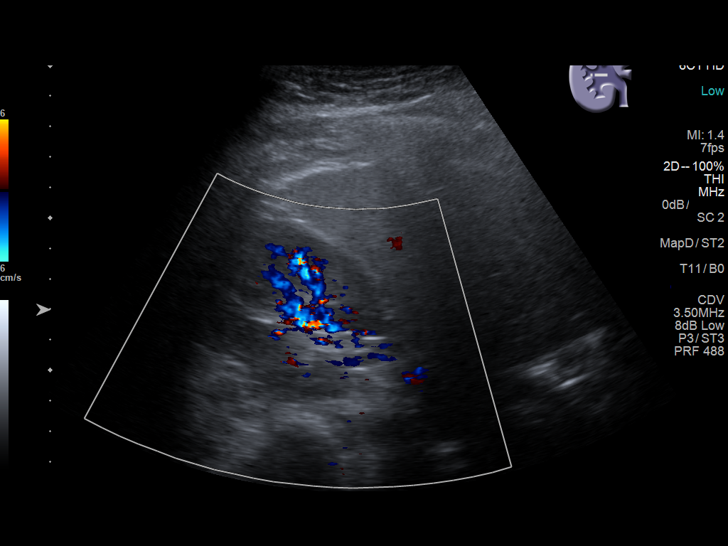
[im 11/29]
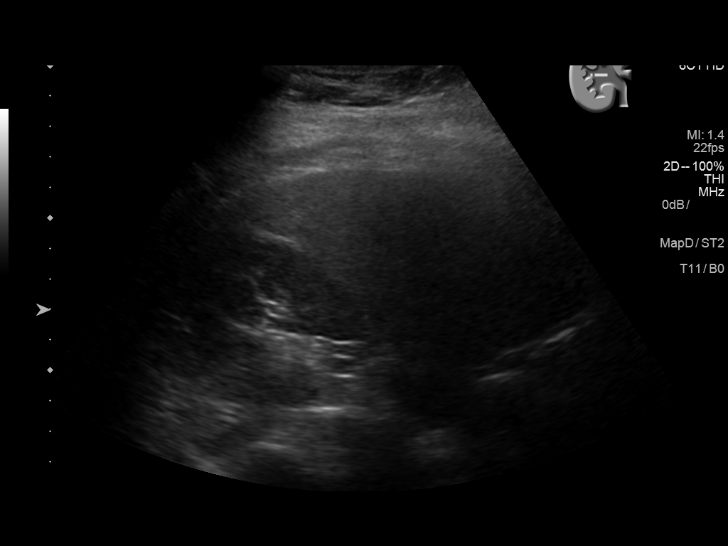
[im 13/29]
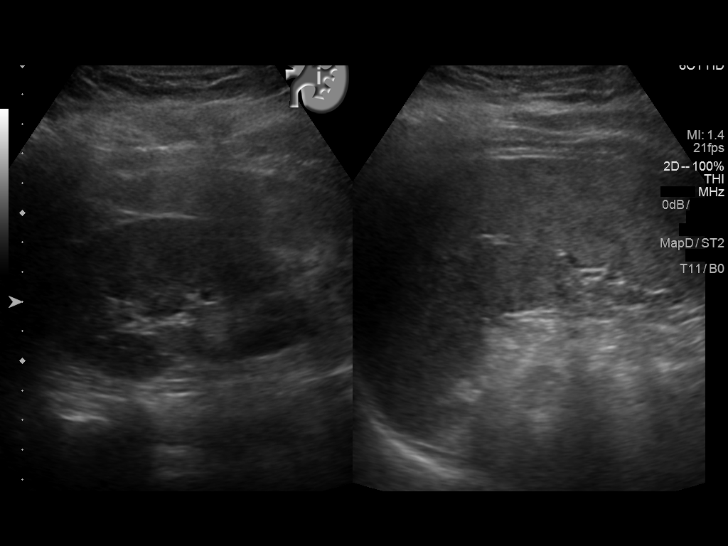
[im 16/29]
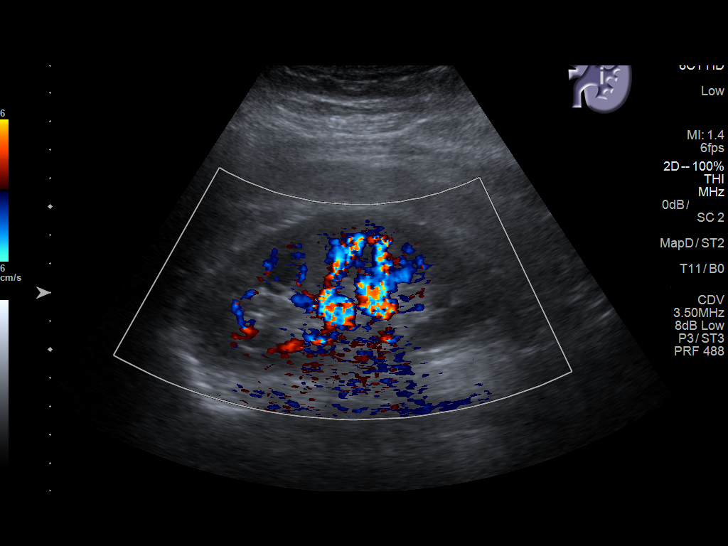
[im 18/29]
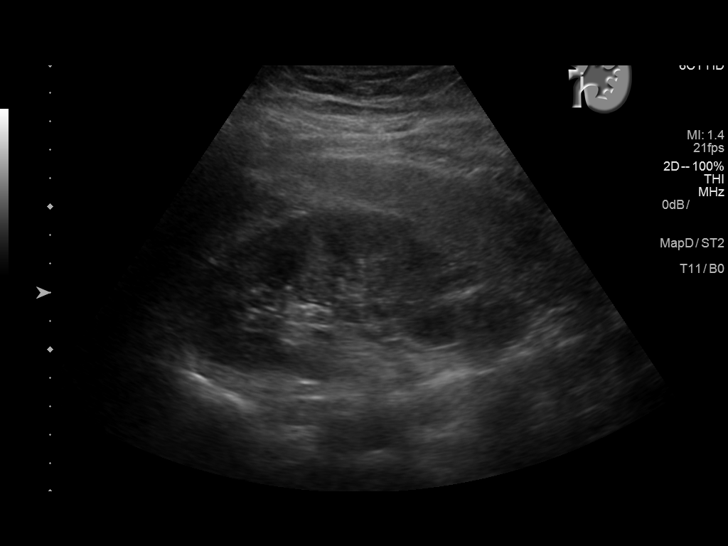
[im 19/29]
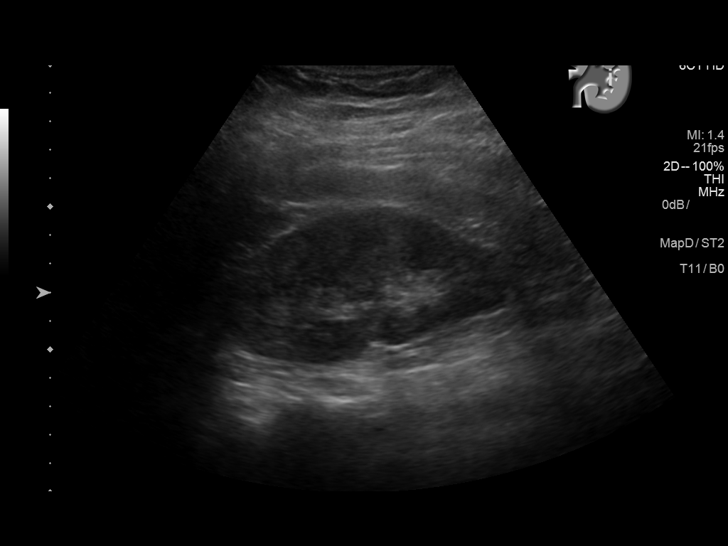
[im 22/29]
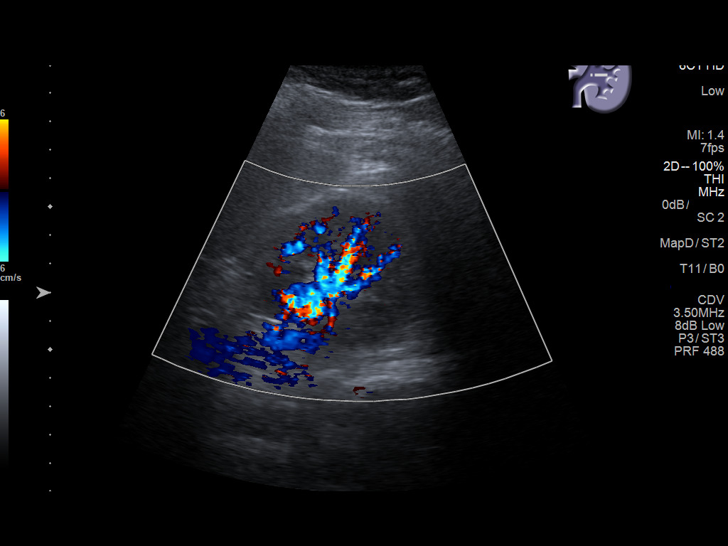
[im 24/29]
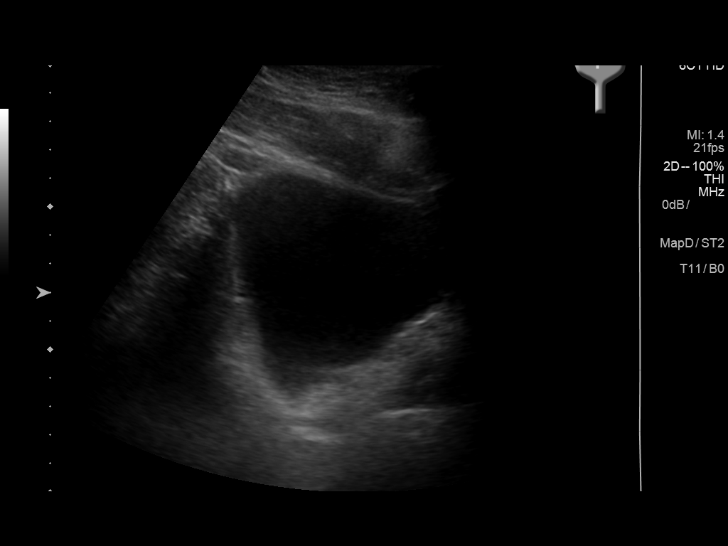
[im 26/29]
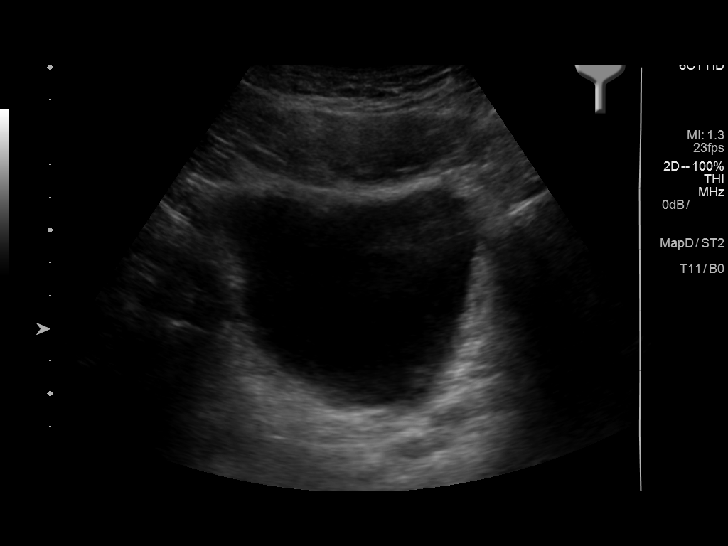
[im 29/29]
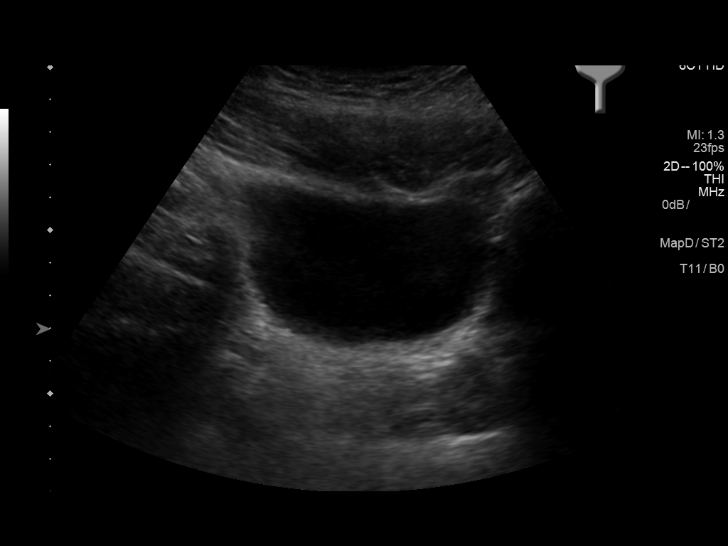

[14 of 25 positions shown; findings below may reference images not displayed]

FINDINGS: Right Kidney:

Length: 11.8 cm. Echogenicity within normal limits. No mass or
hydronephrosis visualized.

Left Kidney:

Length: 11.6 cm. Echogenicity within normal limits. No mass or
hydronephrosis visualized.

Bladder:

Appears normal for degree of bladder distention.
IMPRESSION: 1. Normal renal ultrasound.
2. Hydronephrosis noted in the left kidney on the prior study has
resolved.
